# Patient Record
Sex: Male | Born: 1980 | Race: White | Hispanic: No | Marital: Married | Smoking: Never smoker
Health system: Southern US, Community
[De-identification: ages and names within clinical notes are randomized; demographics above are authoritative.]

## PROBLEM LIST (undated history)

## (undated) DIAGNOSIS — S060XAA Concussion with loss of consciousness status unknown, initial encounter: Secondary | ICD-10-CM

## (undated) DIAGNOSIS — S060X9A Concussion with loss of consciousness of unspecified duration, initial encounter: Secondary | ICD-10-CM

## (undated) DIAGNOSIS — E669 Obesity, unspecified: Secondary | ICD-10-CM

## (undated) HISTORY — PX: HERNIA REPAIR: SHX51

---
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
## 2004-03-16 ENCOUNTER — Emergency Department (HOSPITAL_COMMUNITY): Admission: EM | Admit: 2004-03-16 | Discharge: 2004-03-16 | Payer: Self-pay | Admitting: Emergency Medicine

## 2005-05-31 ENCOUNTER — Emergency Department (HOSPITAL_COMMUNITY): Admission: EM | Admit: 2005-05-31 | Discharge: 2005-05-31 | Payer: Self-pay | Admitting: Emergency Medicine

## 2005-08-30 ENCOUNTER — Emergency Department (HOSPITAL_COMMUNITY): Admission: EM | Admit: 2005-08-30 | Discharge: 2005-08-30 | Payer: Self-pay | Admitting: Emergency Medicine

## 2006-10-31 ENCOUNTER — Emergency Department (HOSPITAL_COMMUNITY): Admission: EM | Admit: 2006-10-31 | Discharge: 2006-10-31 | Payer: Self-pay | Admitting: Emergency Medicine

## 2007-04-03 ENCOUNTER — Emergency Department (HOSPITAL_COMMUNITY): Admission: EM | Admit: 2007-04-03 | Discharge: 2007-04-03 | Payer: Self-pay | Admitting: Emergency Medicine

## 2008-04-07 ENCOUNTER — Emergency Department (HOSPITAL_COMMUNITY): Admission: EM | Admit: 2008-04-07 | Discharge: 2008-04-07 | Payer: Self-pay | Admitting: Emergency Medicine

## 2008-04-20 ENCOUNTER — Emergency Department: Payer: Self-pay | Admitting: Unknown Physician Specialty

## 2008-04-20 ENCOUNTER — Other Ambulatory Visit: Payer: Self-pay

## 2008-06-10 ENCOUNTER — Emergency Department (HOSPITAL_COMMUNITY): Admission: EM | Admit: 2008-06-10 | Discharge: 2008-06-10 | Payer: Self-pay | Admitting: Emergency Medicine

## 2009-02-13 ENCOUNTER — Emergency Department (HOSPITAL_COMMUNITY): Admission: EM | Admit: 2009-02-13 | Discharge: 2009-02-14 | Payer: Self-pay | Admitting: Emergency Medicine

## 2010-01-26 ENCOUNTER — Emergency Department (HOSPITAL_COMMUNITY): Admission: EM | Admit: 2010-01-26 | Discharge: 2010-01-26 | Payer: Self-pay | Admitting: Emergency Medicine

## 2010-02-11 ENCOUNTER — Emergency Department (HOSPITAL_COMMUNITY): Admission: EM | Admit: 2010-02-11 | Discharge: 2010-02-11 | Payer: Self-pay | Admitting: Emergency Medicine

## 2010-02-13 ENCOUNTER — Emergency Department (HOSPITAL_COMMUNITY): Admission: EM | Admit: 2010-02-13 | Discharge: 2010-02-13 | Payer: Self-pay | Admitting: Emergency Medicine

## 2015-08-30 ENCOUNTER — Encounter (HOSPITAL_COMMUNITY): Payer: Self-pay | Admitting: *Deleted

## 2015-08-30 ENCOUNTER — Emergency Department (HOSPITAL_COMMUNITY)
Admission: EM | Admit: 2015-08-30 | Discharge: 2015-08-31 | Disposition: A | Discharge status: Home / Self Care | Payer: BLUE CROSS/BLUE SHIELD | Attending: Emergency Medicine | Admitting: Emergency Medicine

## 2015-08-30 DIAGNOSIS — J069 Acute upper respiratory infection, unspecified: Secondary | ICD-10-CM | POA: Diagnosis not present

## 2015-08-30 DIAGNOSIS — Z79899 Other long term (current) drug therapy: Secondary | ICD-10-CM | POA: Diagnosis not present

## 2015-08-30 DIAGNOSIS — E669 Obesity, unspecified: Secondary | ICD-10-CM | POA: Insufficient documentation

## 2015-08-30 DIAGNOSIS — R0602 Shortness of breath: Secondary | ICD-10-CM

## 2015-08-30 NOTE — ED Notes (Signed)
Pt treated for pneumonia two months ago, and the past two weeks getting more sob, and doesn't feel well

## 2015-08-31 ENCOUNTER — Emergency Department (HOSPITAL_COMMUNITY): Payer: BLUE CROSS/BLUE SHIELD

## 2015-08-31 LAB — CBC WITH DIFFERENTIAL/PLATELET
BASOS ABS: 0.1 10*3/uL (ref 0.0–0.1)
Basophils Relative: 1 % (ref 0–1)
EOS PCT: 4 % (ref 0–5)
Eosinophils Absolute: 0.4 10*3/uL (ref 0.0–0.7)
HEMATOCRIT: 38.9 % — AB (ref 39.0–52.0)
HEMOGLOBIN: 13.3 g/dL (ref 13.0–17.0)
LYMPHS ABS: 2.9 10*3/uL (ref 0.7–4.0)
Lymphocytes Relative: 33 % (ref 12–46)
MCH: 30.5 pg (ref 26.0–34.0)
MCHC: 34.2 g/dL (ref 30.0–36.0)
MCV: 89.2 fL (ref 78.0–100.0)
Monocytes Absolute: 0.8 10*3/uL (ref 0.1–1.0)
Monocytes Relative: 9 % (ref 3–12)
NEUTROS ABS: 4.5 10*3/uL (ref 1.7–7.7)
NEUTROS PCT: 53 % (ref 43–77)
PLATELETS: 218 10*3/uL (ref 150–400)
RBC: 4.36 MIL/uL (ref 4.22–5.81)
RDW: 12.8 % (ref 11.5–15.5)
WBC: 8.6 10*3/uL (ref 4.0–10.5)

## 2015-08-31 LAB — BASIC METABOLIC PANEL
ANION GAP: 5 (ref 5–15)
BUN: 17 mg/dL (ref 6–20)
CHLORIDE: 108 mmol/L (ref 101–111)
CO2: 25 mmol/L (ref 22–32)
Calcium: 8.8 mg/dL — ABNORMAL LOW (ref 8.9–10.3)
Creatinine, Ser: 1.04 mg/dL (ref 0.61–1.24)
GFR calc Af Amer: >60 mL/min (ref >60)
GLUCOSE: 102 mg/dL — AB (ref 65–99)
Potassium: 3.2 mmol/L — ABNORMAL LOW (ref 3.5–5.1)
Sodium: 138 mmol/L (ref 135–145)

## 2015-08-31 LAB — D-DIMER, QUANTITATIVE (NOT AT ARMC)

## 2015-08-31 LAB — TROPONIN I: Troponin I: <0.03 ng/mL (ref <0.031)

## 2015-08-31 MED ORDER — SALINE SPRAY 0.65 % NA SOLN: 1.0000 | NASAL | Status: DC | PRN

## 2015-08-31 NOTE — ED Notes (Signed)
Pt. Ambulated around the hall and back to his room on 98% room air and 89 heart rate.pt.gait steady on his feet. Pt. Complaints being "Wheezy."

## 2015-08-31 NOTE — ED Notes (Signed)
EKG given to EDP, Horton,MD., for review. 

## 2015-08-31 NOTE — ED Provider Notes (Signed)
CSN: [id number]   Arrival date & time 08/30/15 2155  History  This chart was scribed for Shon Baton, MD by Bethel Born, ED Scribe. This patient was seen in room WA22/WA22 and the patient's care was started at 12:12 AM.  Chief Complaint  Patient presents with  . Shortness of Breath    HPI The history is provided by the patient. No language interpreter was used.   Jonathan Guerra is a 34 y.o. male with PMHx of pneumonia and childhood asthma who presents to the Emergency Department complaining of increasing SOB with gradual onset 1 week ago. The SOB is worse with walking. Associated symptoms include cough, chest tightness, and mild sore throat. He had similar symptoms 2 months ago with pneumonia. Pt denies fever, abdominal pain, nausea, and vomiting. Denies tobacco use. No known sick contact. No cardiac history.   History reviewed. No pertinent past medical history.  No past surgical history on file.  History reviewed. No pertinent family history.  Social History  Substance Use Topics  . Smoking status: Never Smoker   . Smokeless tobacco: None  . Alcohol Use: Yes     Comment: very rare     Review of Systems  Constitutional: Negative.  Negative for fever.  HENT: Positive for congestion.   Respiratory: Positive for cough and shortness of breath. Negative for chest tightness.   Cardiovascular: Negative.  Negative for chest pain and leg swelling.  Gastrointestinal: Negative.  Negative for abdominal pain.  Genitourinary: Negative.  Negative for dysuria.  Neurological: Negative for headaches.  All other systems reviewed and are negative.  Home Medications   Prior to Admission medications   Medication Sig Start Date End Date Taking? Authorizing Provider  albuterol (PROVENTIL HFA;VENTOLIN HFA) 108 (90 BASE) MCG/ACT inhaler Inhale 4 puffs into the lungs once.   Yes Historical Provider, MD  sodium chloride (OCEAN) 0.65 % SOLN nasal spray Place 1 spray into both nostrils as needed  for congestion. 08/31/15   Shon Baton, MD    Allergies  Review of patient's allergies indicates no known allergies.  Triage Vitals: BP 129/82 mmHg  Pulse 90  Temp(Src) 97.6 F (36.4 C) (Oral)  Resp 22  Ht 6\' 3"  (1.905 m)  Wt 330 lb (149.687 kg)  BMI 41.25 kg/m2  SpO2 98%  Physical Exam  Constitutional: He is oriented to person, place, and time. He appears well-developed and well-nourished. No distress.  Obese  HENT:  Head: Normocephalic and atraumatic.  Eyes: Pupils are equal, round, and reactive to light.  Cardiovascular: Normal rate, regular rhythm and normal heart sounds.   No murmur heard. Pulmonary/Chest: Effort normal and breath sounds normal. No respiratory distress. He has no wheezes.  Distant breath sounds but clear  Abdominal: Soft. Bowel sounds are normal. There is no tenderness. There is no rebound.  Musculoskeletal: He exhibits no edema.  Neurological: He is alert and oriented to person, place, and time.  Skin: Skin is warm and dry.  Psychiatric: He has a normal mood and affect.  Nursing note and vitals reviewed.   ED Course  Procedures   DIAGNOSTIC STUDIES: Oxygen Saturation is 98% on RA, normal by my interpretation.    COORDINATION OF CARE: 12:14 AM Discussed treatment plan which includes lab work and CXR with pt at bedside and pt agreed to plan.  Labs Reviewed  CBC WITH DIFFERENTIAL/PLATELET - Abnormal; Notable for the following:    HCT 38.9 (*)    All other components within normal limits  BASIC  METABOLIC PANEL - Abnormal; Notable for the following:    Potassium 3.2 (*)    Glucose, Bld 102 (*)    Calcium 8.8 (*)    All other components within normal limits  TROPONIN I  D-DIMER, QUANTITATIVE (NOT AT [facility])    Imaging Review Dg Chest 2 View  08/31/2015   CLINICAL DATA:  34 year old male with shortness of breath  EXAM: CHEST  2 VIEW  COMPARISON:  Radiograph dated 02/11/2010  FINDINGS: The heart size and mediastinal contours are within  normal limits. Both lungs are clear. The visualized skeletal structures are unremarkable.  IMPRESSION: No active cardiopulmonary disease.   Electronically Signed   By: Elgie Collard M.D.   On: 08/31/2015 00:16    EKG Interpretation  Date/Time:  Saturday August 31 2015 01:40:17 EDT Ventricular Rate:  66 PR Interval:  138 QRS Duration: 117 QT Interval:  433 QTC Calculation: 454 R Axis:   37 Text Interpretation:  Sinus rhythm Nonspecific intraventricular conduction delay Low voltage, precordial leads Borderline T abnormalities, inferior leads No longer bradycardic Confirmed by Erilyn Pearman  MD, Makinna Andy ([id number]) on 08/31/2015 1:43:37 AM    MDM   Final diagnoses:  Shortness of breath  Upper respiratory infection   Patient presents with shortness of breath. Reports associated cough and congestion. No fevers.  He is nontoxic and in no acute respiratory distress. Satting 98% on room air. Basic labwork obtained and reassuring. Chest x-ray negative for pneumonia. EKG shows no evidence of acute ischemia. Discussed this with the patient. He's currently clear without wheezing. Doubt HFA would help. Patient reports that shortness of breath has been progressive and ongoing. Worse with exertion. No risk factors for PE and suspect symptoms are related to URI given cough and congestion; however, we'll send screening d-dimer. D-dimer is negative. Discussed the results with the patient has wife. Nasal saline and nasal sterile for congestion. Patient was given cardiology follow-up if symptoms persist as he may need formal stress testing. He is otherwise low risk with the exception of obesity. Patient was able to ambulate and maintain pulse ox.  After history, exam, and medical workup I feel the patient has been appropriately medically screened and is safe for discharge home. Pertinent diagnoses were discussed with the patient. Patient was given return precautions.  I personally performed the services described  in this documentation, which was scribed in my presence. The recorded information has been reviewed and is accurate.   Shon Baton, MD 08/31/15 [phone]

## 2015-08-31 NOTE — Discharge Instructions (Signed)
You were seen today for shortness of breath. The exact cause is unknown. There is no evidence of pneumonia. Your screening for blood clots was negative and your workup was otherwise reassuring. Given your congestion, this may be related to a viral upper respiratory infection. However, given your progressive nature of your symptoms, you will be given cardiology follow-up information if your symptoms persist. Sometimes shortness of breath can be related to heart issues.  Upper Respiratory Infection, Adult An upper respiratory infection (URI) is also sometimes known as the common cold. The upper respiratory tract includes the nose, sinuses, throat, trachea, and bronchi. Bronchi are the airways leading to the lungs. Most people improve within 1 week, but symptoms can last up to 2 weeks. A residual cough may last even longer.  CAUSES Many different viruses can infect the tissues lining the upper respiratory tract. The tissues become irritated and inflamed and often become very moist. Mucus production is also common. A cold is contagious. You can easily spread the virus to others by oral contact. This includes kissing, sharing a glass, coughing, or sneezing. Touching your mouth or nose and then touching a surface, which is then touched by another person, can also spread the virus. SYMPTOMS  Symptoms typically develop 1 to 3 days after you come in contact with a cold virus. Symptoms vary from person to person. They may include:  Runny nose.  Sneezing.  Nasal congestion.  Sinus irritation.  Sore throat.  Loss of voice (laryngitis).  Cough.  Fatigue.  Muscle aches.  Loss of appetite.  Headache.  Low-grade fever. DIAGNOSIS  You might diagnose your own cold based on familiar symptoms, since most people get a cold 2 to 3 times a year. Your caregiver can confirm this based on your exam. Most importantly, your caregiver can check that your symptoms are not due to another disease such as strep  throat, sinusitis, pneumonia, asthma, or epiglottitis. Blood tests, throat tests, and X-rays are not necessary to diagnose a common cold, but they may sometimes be helpful in excluding other more serious diseases. Your caregiver will decide if any further tests are required. RISKS AND COMPLICATIONS  You may be at risk for a more severe case of the common cold if you smoke cigarettes, have chronic heart disease (such as heart failure) or lung disease (such as asthma), or if you have a weakened immune system. The very young and very old are also at risk for more serious infections. Bacterial sinusitis, middle ear infections, and bacterial pneumonia can complicate the common cold. The common cold can worsen asthma and chronic obstructive pulmonary disease (COPD). Sometimes, these complications can require emergency medical care and may be life-threatening. PREVENTION  The best way to protect against getting a cold is to practice good hygiene. Avoid oral or hand contact with people with cold symptoms. Wash your hands often if contact occurs. There is no clear evidence that vitamin C, vitamin E, echinacea, or exercise reduces the chance of developing a cold. However, it is always recommended to get plenty of rest and practice good nutrition. TREATMENT  Treatment is directed at relieving symptoms. There is no cure. Antibiotics are not effective, because the infection is caused by a virus, not by bacteria. Treatment may include:  Increased fluid intake. Sports drinks offer valuable electrolytes, sugars, and fluids.  Breathing heated mist or steam (vaporizer or shower).  Eating chicken soup or other clear broths, and maintaining good nutrition.  Getting plenty of rest.  Using gargles or lozenges  for comfort.  Controlling fevers with ibuprofen or acetaminophen as directed by your caregiver.  Increasing usage of your inhaler if you have asthma. Zinc gel and zinc lozenges, taken in the first 24 hours of  the common cold, can shorten the duration and lessen the severity of symptoms. Pain medicines may help with fever, muscle aches, and throat pain. A variety of non-prescription medicines are available to treat congestion and runny nose. Your caregiver can make recommendations and may suggest nasal or lung inhalers for other symptoms.  HOME CARE INSTRUCTIONS   Only take over-the-counter or prescription medicines for pain, discomfort, or fever as directed by your caregiver.  Use a warm mist humidifier or inhale steam from a shower to increase air moisture. This may keep secretions moist and make it easier to breathe.  Drink enough water and fluids to keep your urine clear or pale yellow.  Rest as needed.  Return to work when your temperature has returned to normal or as your caregiver advises. You may need to stay home longer to avoid infecting others. You can also use a face mask and careful hand washing to prevent spread of the virus. SEEK MEDICAL CARE IF:   After the first few days, you feel you are getting worse rather than better.  You need your caregiver's advice about medicines to control symptoms.  You develop chills, worsening shortness of breath, or brown or red sputum. These may be signs of pneumonia.  You develop yellow or brown nasal discharge or pain in the face, especially when you bend forward. These may be signs of sinusitis.  You develop a fever, swollen neck glands, pain with swallowing, or white areas in the back of your throat. These may be signs of strep throat. SEEK IMMEDIATE MEDICAL CARE IF:   You have a fever.  You develop severe or persistent headache, ear pain, sinus pain, or chest pain.  You develop wheezing, a prolonged cough, cough up blood, or have a change in your usual mucus (if you have chronic lung disease).  You develop sore muscles or a stiff neck. Document Released: 06/02/2001 Document Revised: 02/29/2012 Document Reviewed: 03/14/2014 [facility]  Patient Information 2015 [place], [place]. This information is not intended to replace advice given to you by your health care provider. Make sure you discuss any questions you have with your health care provider. Shortness of Breath Shortness of breath means you have trouble breathing. It could also mean that you have a medical problem. You should get immediate medical care for shortness of breath. CAUSES   Not enough oxygen in the air such as with high altitudes or a smoke-filled room.  Certain lung diseases, infections, or problems.  Heart disease or conditions, such as angina or heart failure.  Low red blood cells (anemia).  Poor physical fitness, which can cause shortness of breath when you exercise.  Chest or back injuries or stiffness.  Being overweight.  Smoking.  Anxiety, which can make you feel like you are not getting enough air. DIAGNOSIS  Serious medical problems can often be found during your physical exam. Tests may also be done to determine why you are having shortness of breath. Tests may include:  Chest X-rays.  Lung function tests.  Blood tests.  An electrocardiogram (ECG).  An ambulatory electrocardiogram. An ambulatory ECG records your heartbeat patterns over a 24-hour period.  Exercise testing.  A transthoracic echocardiogram (TTE). During echocardiography, sound waves are used to evaluate how blood flows through your heart.  A transesophageal  echocardiogram (TEE).  Imaging scans. Your health care provider may not be able to find a cause for your shortness of breath after your exam. In this case, it is important to have a follow-up exam with your health care provider as directed.  TREATMENT  Treatment for shortness of breath depends on the cause of your symptoms and can vary greatly. HOME CARE INSTRUCTIONS   Do not smoke. Smoking is a common cause of shortness of breath. If you smoke, ask for help to quit.  Avoid being around chemicals or things  that may bother your breathing, such as paint fumes and dust.  Rest as needed. Slowly resume your usual activities.  If medicines were prescribed, take them as directed for the full length of time directed. This includes oxygen and any inhaled medicines.  Keep all follow-up appointments as directed by your health care provider. SEEK MEDICAL CARE IF:   Your condition does not improve in the time expected.  You have a hard time doing your normal activities even with rest.  You have any new symptoms. SEEK IMMEDIATE MEDICAL CARE IF:   Your shortness of breath gets worse.  You feel light-headed, faint, or develop a cough not controlled with medicines.  You start coughing up blood.  You have pain with breathing.  You have chest pain or pain in your arms, shoulders, or abdomen.  You have a fever.  You are unable to walk up stairs or exercise the way you normally do. MAKE SURE YOU:  Understand these instructions.  Will watch your condition.  Will get help right away if you are not doing well or get worse. Document Released: 09/01/2001 Document Revised: 12/12/2013 Document Reviewed: 02/22/2012 [facility] Patient Information 2015 [place], [place]. This information is not intended to replace advice given to you by your health care provider. Make sure you discuss any questions you have with your health care provider.

## 2018-02-12 ENCOUNTER — Encounter (HOSPITAL_COMMUNITY): Payer: Self-pay | Admitting: Emergency Medicine

## 2018-02-12 ENCOUNTER — Emergency Department (HOSPITAL_COMMUNITY)
Admission: EM | Admit: 2018-02-12 | Discharge: 2018-02-12 | Disposition: A | Discharge status: Home / Self Care | Payer: Self-pay | Attending: Emergency Medicine | Admitting: Emergency Medicine

## 2018-02-12 ENCOUNTER — Emergency Department (HOSPITAL_COMMUNITY): Payer: Self-pay

## 2018-02-12 DIAGNOSIS — R0989 Other specified symptoms and signs involving the circulatory and respiratory systems: Secondary | ICD-10-CM | POA: Insufficient documentation

## 2018-02-12 DIAGNOSIS — H9202 Otalgia, left ear: Secondary | ICD-10-CM | POA: Insufficient documentation

## 2018-02-12 DIAGNOSIS — R0602 Shortness of breath: Secondary | ICD-10-CM | POA: Insufficient documentation

## 2018-02-12 MED ORDER — DIPHENHYDRAMINE HCL 25 MG PO CAPS
25.0000 mg | ORAL_CAPSULE | Freq: Once | ORAL | Status: AC
  Administered 2018-02-12: 25 mg via ORAL
  Filled 2018-02-12: qty 1

## 2018-02-12 MED ORDER — GI COCKTAIL ~~LOC~~
30.0000 mL | Freq: Once | ORAL | Status: AC
  Administered 2018-02-12: 30 mL via ORAL
  Filled 2018-02-12: qty 30

## 2018-02-12 MED ORDER — PREDNISONE 20 MG PO TABS: 40.0000 mg | ORAL_TABLET | Freq: Every day | ORAL | 0 refills | Status: DC

## 2018-02-12 MED ORDER — DIPHENHYDRAMINE HCL 25 MG PO TABS: 25.0000 mg | ORAL_TABLET | Freq: Four times a day (QID) | ORAL | 0 refills | Status: DC

## 2018-02-12 MED ORDER — PREDNISONE 20 MG PO TABS
60.0000 mg | ORAL_TABLET | Freq: Once | ORAL | Status: AC
  Administered 2018-02-12: 60 mg via ORAL
  Filled 2018-02-12: qty 3

## 2018-02-12 MED ORDER — FAMOTIDINE 20 MG PO TABS
20.0000 mg | ORAL_TABLET | Freq: Once | ORAL | Status: AC
  Administered 2018-02-12: 20 mg via ORAL
  Filled 2018-02-12: qty 1

## 2018-02-12 NOTE — Discharge Instructions (Signed)
He was seen today for a foreign body sensation in her throat and shortness of breath.  It is unclear whether you are actually having an allergic reaction.  You were treated as such.  You do have some swelling of her uvula which can be allergic in nature.  This seems isolated.  You were treated with steroids, Benadryl, and Pepcid.  Can continue at home.  [facility].  Avoid fish in the future.

## 2018-02-12 NOTE — ED Provider Notes (Signed)
[facility] EMERGENCY DEPARTMENT Provider Note   CSN: [id number] Arrival date & time: 02/12/18  0108     History   Chief Complaint Chief Complaint  Patient presents with  . Otalgia  . Allergic Reaction    HPI Jonathan Guerra is a 37 y.o. male.  HPI  This is a 37 year old male who presents with several complaints.  Denies any significant past medical history.  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  Yesterday he ate some canned tuna and since that time has had sore throat and a foreign body sensation.  He has been able to drink and eat but states that it hurts.  Rates his pain at 10 out of 10.  He has not taken anything for his symptoms.  Denies any fevers.  Has not taken anything for his symptoms.  At baseline reports shortness of breath.  This is not new.  He also states that he has pain in his left ear.  He feels that he was bitten by spider.  History reviewed. No pertinent past medical history.  There are no active problems to display for this patient.   History reviewed. No pertinent surgical history.     Home Medications    Prior to Admission medications   Medication Sig Start Date End Date Taking? Authorizing Provider  albuterol (PROVENTIL HFA;VENTOLIN HFA) 108 (90 BASE) MCG/ACT inhaler Inhale 4 puffs into the lungs once.    [provider]  diphenhydrAMINE (BENADRYL) 25 MG tablet Take 1 tablet (25 mg total) by mouth every 6 (six) hours. 02/12/18   Jonisha Kindig, Mayer Masker, MD  predniSONE (DELTASONE) 20 MG tablet Take 2 tablets (40 mg total) by mouth daily. 02/12/18   Jeanni Allshouse, Mayer Masker, MD  sodium chloride (OCEAN) 0.65 % SOLN nasal spray Place 1 spray into both nostrils as needed for congestion. 08/31/15   Sheritta Deeg, Mayer Masker, MD    Family History No family history on file.  Social History Social History   Tobacco Use  . Smoking status: Never Smoker  . Smokeless tobacco: Never Used  Substance Use Topics  . Alcohol use: Yes   Comment: very rare  . Drug use: No     Allergies   Patient has no known allergies.   Review of Systems Review of Systems  Constitutional: Negative for fever.  HENT: Positive for ear pain and sore throat. Negative for trouble swallowing.   Respiratory: Positive for shortness of breath. Negative for cough.   Cardiovascular: Negative for chest pain.  Gastrointestinal: Negative for abdominal pain, nausea and vomiting.  Genitourinary: Negative for dysuria.  Skin: Negative for rash.  All other systems reviewed and are negative.    Physical Exam Updated Vital Signs BP (!) 128/98   Pulse 89   Temp 98 F (36.7 C) (Oral)   Resp 16   SpO2 97%   Physical Exam  Constitutional: He is oriented to person, place, and time. He appears well-developed and well-nourished.  Obese, no acute distress  HENT:  Head: Normocephalic and atraumatic.  Right Ear: External ear normal.  Left Ear: External ear normal.  Mouth/Throat: Oropharynx is clear and moist.  Eyes: Pupils are equal, round, and reactive to light.  Neck: Normal range of motion. Neck supple.  Cardiovascular: Normal rate, regular rhythm and normal heart sounds.  No murmur heard. Pulmonary/Chest: Effort normal and breath sounds normal. No respiratory distress. He has no wheezes.  Abdominal: Soft. Bowel sounds are normal. There is no tenderness. There is no  rebound.  Musculoskeletal: He exhibits edema.  Neurological: He is alert and oriented to person, place, and time.  Skin: Skin is warm and dry.  Psychiatric: He has a normal mood and affect.  Nursing note and vitals reviewed.    ED Treatments / Results  Labs (all labs ordered are listed, but only abnormal results are displayed) Labs Reviewed - No data to display  EKG  EKG Interpretation None       Radiology Dg Chest 2 View  Result Date: 02/12/2018 CLINICAL DATA:  Shortness of breath EXAM: CHEST  2 VIEW COMPARISON:  08/31/2015 FINDINGS: The heart size and mediastinal  contours are within normal limits. Both lungs are clear. The visualized skeletal structures are unremarkable. IMPRESSION: No active cardiopulmonary disease. Electronically Signed   By: Jasmine PangKim  Fujinaga M.D.   On: 02/12/2018 03:03    Procedures Procedures (including critical care time)  Medications Ordered in ED Medications  diphenhydrAMINE (BENADRYL) capsule 25 mg (25 mg Oral Given 02/12/18 0259)  famotidine (PEPCID) tablet 20 mg (20 mg Oral Given 02/12/18 0259)  predniSONE (DELTASONE) tablet 60 mg (60 mg Oral Given 02/12/18 0259)  gi cocktail (Maalox,Lidocaine,Donnatal) (30 mLs Oral Given 02/12/18 0424)     Initial Impression / Assessment and Plan / ED Course  I have reviewed the triage vital signs and the nursing notes.  Pertinent labs & imaging results that were available during my care of the patient were reviewed by me and considered in my medical decision making (see chart for details).     Patient presents with foreign body sensation of the throat and sore throat after eating tuna.  Also reports persistent shortness of breath.  He is nontoxic no signs or symptoms of anaphylaxis.  Suspect given his symptoms that this may be more related to a mild impaction or esophageal irritation versus true allergic reaction.  Given shortness of breath, chest x-ray was obtained and does not show any evidence of pneumonia or foreign body.  Patient is tolerating fluids without difficulty.  Given patient's concern and history of reported fish allergy, he was given prednisone, Benadryl, Pepcid.  No indication at this time for epinephrine.  I discussed this with the patient.  He does not have a primary care physician.  [facility].  After history, exam, and medical workup I feel the patient has been appropriately medically screened and is safe for discharge home. Pertinent diagnoses were discussed with the patient. Patient was given return precautions.   Final Clinical Impressions(s) /  ED Diagnoses   Final diagnoses:  Foreign body sensation in throat  SOB (shortness of breath)    ED Discharge Orders        Ordered    diphenhydrAMINE (BENADRYL) 25 MG tablet  Every 6 hours     02/12/18 0430    predniSONE (DELTASONE) 20 MG tablet  Daily     02/12/18 0430       Shon BatonHorton, Spirit Wernli F, MD 02/12/18 [phone]

## 2018-02-12 NOTE — ED Triage Notes (Signed)
Pt c/o sore throat after eating tuna at noon today. Pt reports an allergy to fish. LS clear, NAD noted. Pt also c/o ear pain after being bitten by a spider 3 days ago.

## 2018-07-05 ENCOUNTER — Encounter (HOSPITAL_COMMUNITY): Payer: Self-pay

## 2018-07-05 ENCOUNTER — Other Ambulatory Visit: Payer: Self-pay

## 2018-07-05 ENCOUNTER — Emergency Department (HOSPITAL_COMMUNITY): Payer: Self-pay

## 2018-07-05 ENCOUNTER — Emergency Department (HOSPITAL_COMMUNITY)
Admission: EM | Admit: 2018-07-05 | Discharge: 2018-07-05 | Disposition: A | Discharge status: Home / Self Care | Payer: Self-pay | Attending: Emergency Medicine | Admitting: Emergency Medicine

## 2018-07-05 DIAGNOSIS — Z79899 Other long term (current) drug therapy: Secondary | ICD-10-CM | POA: Insufficient documentation

## 2018-07-05 DIAGNOSIS — K429 Umbilical hernia without obstruction or gangrene: Secondary | ICD-10-CM | POA: Insufficient documentation

## 2018-07-05 HISTORY — DX: Obesity, unspecified: E66.9

## 2018-07-05 HISTORY — DX: Concussion with loss of consciousness status unknown, initial encounter: S06.0XAA

## 2018-07-05 HISTORY — DX: Concussion with loss of consciousness of unspecified duration, initial encounter: S06.0X9A

## 2018-07-05 LAB — BASIC METABOLIC PANEL
Anion gap: 8 (ref 5–15)
BUN: 12 mg/dL (ref 6–20)
CALCIUM: 9.4 mg/dL (ref 8.9–10.3)
CO2: 26 mmol/L (ref 22–32)
Chloride: 106 mmol/L (ref 98–111)
Creatinine, Ser: 0.98 mg/dL (ref 0.61–1.24)
GFR calc Af Amer: >60 mL/min (ref >60)
GFR calc non Af Amer: >60 mL/min (ref >60)
GLUCOSE: 101 mg/dL — AB (ref 70–99)
Potassium: 4.2 mmol/L (ref 3.5–5.1)
Sodium: 140 mmol/L (ref 135–145)

## 2018-07-05 LAB — URINALYSIS, ROUTINE W REFLEX MICROSCOPIC
BILIRUBIN URINE: NEGATIVE
Glucose, UA: NEGATIVE mg/dL
HGB URINE DIPSTICK: NEGATIVE
Ketones, ur: 20 mg/dL — AB
Leukocytes, UA: NEGATIVE
Nitrite: NEGATIVE
Protein, ur: NEGATIVE mg/dL
SPECIFIC GRAVITY, URINE: 1.029 (ref 1.005–1.030)
pH: 5 (ref 5.0–8.0)

## 2018-07-05 LAB — CBC
HCT: 42.8 % (ref 39.0–52.0)
Hemoglobin: 14.7 g/dL (ref 13.0–17.0)
MCH: 30.8 pg (ref 26.0–34.0)
MCHC: 34.3 g/dL (ref 30.0–36.0)
MCV: 89.7 fL (ref 78.0–100.0)
PLATELETS: 279 10*3/uL (ref 150–400)
RBC: 4.77 MIL/uL (ref 4.22–5.81)
RDW: 12.7 % (ref 11.5–15.5)
WBC: 9.3 10*3/uL (ref 4.0–10.5)

## 2018-07-05 LAB — HEPATIC FUNCTION PANEL
ALT: 42 U/L (ref 0–44)
AST: 35 U/L (ref 15–41)
Albumin: 3.9 g/dL (ref 3.5–5.0)
Alkaline Phosphatase: 57 U/L (ref 38–126)
BILIRUBIN TOTAL: 0.7 mg/dL (ref 0.3–1.2)
Total Protein: 7.2 g/dL (ref 6.5–8.1)

## 2018-07-05 LAB — I-STAT TROPONIN, ED
TROPONIN I, POC: 0 ng/mL (ref 0.00–0.08)
TROPONIN I, POC: 0 ng/mL (ref 0.00–0.08)

## 2018-07-05 LAB — LIPASE, BLOOD: LIPASE: 24 U/L (ref 11–51)

## 2018-07-05 MED ORDER — IOPAMIDOL (ISOVUE-300) INJECTION 61%
INTRAVENOUS | Status: AC
  Filled 2018-07-05: qty 100

## 2018-07-05 MED ORDER — DICYCLOMINE HCL 10 MG/ML IM SOLN
20.0000 mg | Freq: Once | INTRAMUSCULAR | Status: AC
  Administered 2018-07-05: 20 mg via INTRAMUSCULAR
  Filled 2018-07-05: qty 2

## 2018-07-05 MED ORDER — DICYCLOMINE HCL 20 MG PO TABS: 20.0000 mg | ORAL_TABLET | Freq: Two times a day (BID) | ORAL | 0 refills | Status: DC

## 2018-07-05 MED ORDER — IOPAMIDOL (ISOVUE-300) INJECTION 61%
100.0000 mL | Freq: Once | INTRAVENOUS | Status: AC | PRN
  Administered 2018-07-05: 100 mL via INTRAVENOUS

## 2018-07-05 MED ORDER — FAMOTIDINE 20 MG PO TABS: 20.0000 mg | ORAL_TABLET | Freq: Two times a day (BID) | ORAL | 0 refills | Status: DC

## 2018-07-05 MED ORDER — FAMOTIDINE IN NACL 20-0.9 MG/50ML-% IV SOLN
20.0000 mg | Freq: Once | INTRAVENOUS | Status: AC
Start: 2018-07-05 — End: 2018-07-05
  Administered 2018-07-05: 20 mg via INTRAVENOUS
  Filled 2018-07-05: qty 50

## 2018-07-05 MED ORDER — SODIUM CHLORIDE 0.9 % IV BOLUS
1000.0000 mL | Freq: Once | INTRAVENOUS | Status: AC
  Administered 2018-07-05: 1000 mL via INTRAVENOUS

## 2018-07-05 NOTE — ED Notes (Signed)
Patient reports he is having 10/10 abdominal pain, but refuses pain medication at this time. He says he wants to wait until "they figure out what is going on."

## 2018-07-05 NOTE — ED Triage Notes (Signed)
Patient c/o left chest pain that radiates into the back,  mid abdominal pain, gastrointestinal reflux, and nausea x 2 days. patient states the abdominal pain started first. Patient reports that he has SOB,but is not unusual due to weight.

## 2018-07-05 NOTE — ED Provider Notes (Addendum)
[facility]-EMERGENCY DEPT Provider Note   CSN: [id number] Arrival date & time: 07/05/18  1014     History   Chief Complaint Chief Complaint  Patient presents with  . Chest Pain  . Abdominal Pain  . Gastroesophageal Reflux    HPI Jonathan Guerra is a 37 y.o. male who presents to ED for evaluation of periumbilical abdominal pain now radiating to right lower quadrant and right groin for the past 5 days, nausea and heartburn sensation.  States that he has never experienced heartburn before.  Describes the heartburn as pain starting in his throat and radiating down to the epigastric area.  No improvement with changing diet.  He also has pinpoint left-sided chest pain worse with palpation.  This started yesterday.  No history of similar symptoms in the past.  He has not taken any medicine to help with his symptoms.  Denies any prior MI, DVT, PE, abdominal surgeries, chronic NSAID use.  Denies any changes in urination, diarrhea, constipation, recent surgeries, recent prolonged travel, hormone use.  Has a history of kidney stones but states that this feels different.  No sick contacts with similar symptoms.  HPI  Past Medical History:  Diagnosis Date  . Concussion   . Obesity     There are no active problems to display for this patient.   Past Surgical History:  Procedure Laterality Date  . HERNIA REPAIR          Home Medications    Prior to Admission medications   Medication Sig Start Date End Date Taking? Authorizing Provider  diphenhydrAMINE (BENADRYL) 25 MG tablet Take 1 tablet (25 mg total) by mouth every 6 (six) hours. 02/12/18  Yes Horton, Mayer Masker, MD  dicyclomine (BENTYL) 20 MG tablet Take 1 tablet (20 mg total) by mouth 2 (two) times daily. 07/05/18   Heer Justiss, PA-C  famotidine (PEPCID) 20 MG tablet Take 1 tablet (20 mg total) by mouth 2 (two) times daily. 07/05/18   Laryn Venning, PA-C  predniSONE (DELTASONE) 20 MG tablet Take 2 tablets (40 mg  total) by mouth daily. Patient not taking: Reported on 07/05/2018 02/12/18   Horton, Mayer Masker, MD  sodium chloride (OCEAN) 0.65 % SOLN nasal spray Place 1 spray into both nostrils as needed for congestion. Patient not taking: Reported on 07/05/2018 08/31/15   Horton, Mayer Masker, MD    Family History Family History  Problem Relation Age of Onset  . Diabetes Mother   . Stroke Father   . Heart failure Father     Social History Social History   Tobacco Use  . Smoking status: Never Smoker  . Smokeless tobacco: Never Used  Substance Use Topics  . Alcohol use: Yes    Comment: very rare  . Drug use: No     Allergies   Shrimp [shellfish allergy]   Review of Systems Review of Systems  Constitutional: Positive for appetite change. Negative for chills and fever.  HENT: Negative for ear pain, rhinorrhea, sneezing and sore throat.   Eyes: Negative for photophobia and visual disturbance.  Respiratory: Negative for cough, chest tightness, shortness of breath and wheezing.   Cardiovascular: Positive for chest pain. Negative for palpitations.  Gastrointestinal: Positive for abdominal pain and nausea. Negative for blood in stool, constipation, diarrhea and vomiting.  Genitourinary: Negative for dysuria, hematuria and urgency.  Musculoskeletal: Negative for myalgias.  Skin: Negative for rash.  Neurological: Negative for dizziness, weakness and light-headedness.     Physical Exam Updated Vital  Signs BP 117/67   Pulse (!) 56   Temp 98.3 F (36.8 C) (Oral)   Resp 18   Ht 6\' 3"  (1.905 m)   Wt (!) 170.1 kg (375 lb)   SpO2 99%   BMI 46.87 kg/m   Physical Exam  Constitutional: He appears well-developed and well-nourished. No distress.  HENT:  Head: Normocephalic and atraumatic.  Nose: Nose normal.  Eyes: Conjunctivae and EOM are normal. Left eye exhibits no discharge. No scleral icterus.  Neck: Normal range of motion. Neck supple.  Cardiovascular: Normal rate, regular rhythm,  normal heart sounds and intact distal pulses. Exam reveals no gallop and no friction rub.  No murmur heard. Pulmonary/Chest: Effort normal and breath sounds normal. No respiratory distress. He exhibits tenderness.    Abdominal: Soft. Bowel sounds are normal. He exhibits no distension. There is tenderness in the right lower quadrant, periumbilical area and suprapubic area. There is no guarding.  Bilateral CVA tenderness.  Musculoskeletal: Normal range of motion. He exhibits no edema.  Neurological: He is alert. He exhibits normal muscle tone. Coordination normal.  Skin: Skin is warm and dry. No rash noted.  Psychiatric: He has a normal mood and affect.  Nursing note and vitals reviewed.    ED Treatments / Results  Labs (all labs ordered are listed, but only abnormal results are displayed) Labs Reviewed  BASIC METABOLIC PANEL - Abnormal; Notable for the following components:      Result Value   Glucose, Bld 101 (*)    All other components within normal limits  URINALYSIS, ROUTINE W REFLEX MICROSCOPIC - Abnormal; Notable for the following components:   APPearance HAZY (*)    Ketones, ur 20 (*)    All other components within normal limits  CBC  HEPATIC FUNCTION PANEL  LIPASE, BLOOD  I-STAT TROPONIN, ED  I-STAT TROPONIN, ED    EKG EKG Interpretation  Date/Time:  Tuesday July 05 2018 10:31:03 EDT Ventricular Rate:  75 PR Interval:    QRS Duration: 105 QT Interval:  382 QTC Calculation: 427 R Axis:   30 Text Interpretation:  Sinus rhythm Borderline T abnormalities, inferior leads No significant change since last tracing Confirmed by Shaune PollackIsaacs, Cameron [phone]) on 07/05/2018 10:08:26 PM   Radiology Dg Chest 2 View  Result Date: 07/05/2018 CLINICAL DATA:  Left chest pain radiating into the left shoulder and scapula for 2 days EXAM: CHEST - 2 VIEW COMPARISON:  02/12/2018 FINDINGS: The heart size and mediastinal contours are within normal limits. Both lungs are clear. The visualized  skeletal structures are unremarkable. IMPRESSION: No active cardiopulmonary disease. Electronically Signed   By: Elige KoHetal  Patel   On: 07/05/2018 10:55   Ct Abdomen Pelvis W Contrast  Result Date: 07/05/2018 CLINICAL DATA:  Nausea for 2 days, LEFT chest pain radiating to back. Shortness of breath. History of hernia repair. EXAM: CT ABDOMEN AND PELVIS WITH CONTRAST TECHNIQUE: Multidetector CT imaging of the abdomen and pelvis was performed using the standard protocol following bolus administration of intravenous contrast. CONTRAST:  100mL ISOVUE-300 IOPAMIDOL (ISOVUE-300) INJECTION 61% COMPARISON:  None. FINDINGS: LOWER CHEST: Lung bases are clear. Included heart size is normal. No pericardial effusion. HEPATOBILIARY: The liver is diffusely hypodense compatible with steatosis, focal fatty sparing about the gallbladder fossa. Normal gallbladder. PANCREAS: Normal. SPLEEN: Normal. ADRENALS/URINARY TRACT: Kidneys are orthotopic, demonstrating symmetric enhancement. No nephrolithiasis, hydronephrosis or solid renal masses. The unopacified ureters are normal in course and caliber. Urinary bladder is decompressed and unremarkable. Normal adrenal glands. STOMACH/BOWEL: The stomach, small  and large bowel are normal in course and caliber without inflammatory changes, sensitivity decreased without enteric contrast. Mild colonic diverticulosis. Normal appendix. VASCULAR/LYMPHATIC: Aortoiliac vessels are normal in course and caliber. No lymphadenopathy by CT size criteria. REPRODUCTIVE: Normal. OTHER: No intraperitoneal free fluid or free air. MUSCULOSKELETAL: Focal anterior abdominal wall supraumbilical subcutaneous fat stranding. No subcutaneous gas or radiopaque foreign bodies. Small bilateral fat containing inguinal hernias. Small to moderate fat containing umbilical hernia. Moderate L5-S1 facet arthropathy and disc bulge. Moderate neural foraminal narrowing. IMPRESSION: 1. No acute intra-abdominal or pelvic process. 2.  Hepatic steatosis. 3. Small to moderate fat containing umbilical hernia with supraumbilical focal subcutaneous fat stranding seen with necrosis or contusion. Electronically Signed   By: Awilda Metro M.D.   On: 07/05/2018 18:26    Procedures Procedures (including critical care time)  Medications Ordered in ED Medications  sodium chloride 0.9 % bolus 1,000 mL (0 mLs Intravenous Stopped 07/05/18 1821)  iopamidol (ISOVUE-300) 61 % injection 100 mL (100 mLs Intravenous Contrast Given 07/05/18 1757)  famotidine (PEPCID) IVPB 20 mg premix (20 mg Intravenous New Bag/Given 07/05/18 2106)  dicyclomine (BENTYL) injection 20 mg (20 mg Intramuscular Given 07/05/18 2106)     Initial Impression / Assessment and Plan / ED Course  I have reviewed the triage vital signs and the nursing notes.  Pertinent labs & imaging results that were available during my care of the patient were reviewed by me and considered in my medical decision making (see chart for details).  Clinical Course as of Jul 06 2215  Tue Jul 05, 2018  1640 Patient declines pain medication.   [HK]    Clinical Course User Index [HK] Dietrich Pates, PA-C    37 year old male presents to ED for evaluation of periumbilical abdominal pain radiating to right lower quadrant and right groin for the past 5 days, nausea and heartburn sensation.  Also reports pinpoint left-sided chest pain worse with palpation.  No history of similar symptoms either in the past.  Has not taken any medicine help with the symptoms.  Denies any prior MI, PE, changes in bowel movements, chronic NSAID use, recent surgeries, recent prolonged trip for hormone use.  Feels different than his history of kidney stones.  On physical exam patient is overall well-appearing.  There is periumbilical, suprapubic, right lower quadrant and left lower quadrant abdominal tenderness to palpation without rebound or guarding noted.  He appears overall well.  He is afebrile.  He is not  tachycardic, tachypneic or hypotensive.  Does not appear dehydrated.  EKG shows no changes from prior tracings.  Troponin negative x2.  CBC, CMP, urinalysis, lipase unremarkable.  Chest x-ray unremarkable.  CT of abdomen pelvis with no acute findings but did show umbilical fat-containing hernia.  Patient refused pain medication.  Some improvement with Pepcid and Bentyl given here.  Will discharge home with these medications, surgery follow-up.  Suspect MSK  Cause of chest pain. Doubt cardiac or pulmonary etiology based on PERC negative, low risk by HEART score. Patient again declines any other pain medication.  Advised to return to ED for any severe worsening symptoms.  Portions of this note were generated with Scientist, clinical (histocompatibility and immunogenetics). Dictation errors may occur despite best attempts at proofreading.   Final Clinical Impressions(s) / ED Diagnoses   Final diagnoses:  Umbilical hernia without obstruction and without gangrene    ED Discharge Orders        Ordered    famotidine (PEPCID) 20 MG tablet  2 times daily  07/05/18 2214    dicyclomine (BENTYL) 20 MG tablet  2 times daily     07/05/18 2214          Dietrich Pates, PA-C 07/05/18 2220    Shaune Pollack, MD 07/06/18 1006

## 2018-07-05 NOTE — ED Notes (Signed)
Patient is aware urine sample needed 

## 2018-07-05 NOTE — Discharge Instructions (Signed)
Please read attached information regarding your condition. Take the following medications to help with your heartburn and abdominal discomfort. Return to ED for worsening symptoms, fever, vomiting up blood, blood in your stool, severe chest pain or trouble breathing.

## 2019-07-06 ENCOUNTER — Encounter (HOSPITAL_COMMUNITY): Payer: Self-pay | Admitting: Pharmacy Technician

## 2019-07-06 ENCOUNTER — Other Ambulatory Visit: Payer: Self-pay

## 2019-07-06 ENCOUNTER — Emergency Department (HOSPITAL_COMMUNITY)
Admission: EM | Admit: 2019-07-06 | Discharge: 2019-07-06 | Disposition: A | Discharge status: Home / Self Care | Payer: HRSA Program | Attending: Emergency Medicine | Admitting: Emergency Medicine

## 2019-07-06 ENCOUNTER — Emergency Department (HOSPITAL_COMMUNITY): Payer: HRSA Program

## 2019-07-06 DIAGNOSIS — R109 Unspecified abdominal pain: Secondary | ICD-10-CM | POA: Insufficient documentation

## 2019-07-06 DIAGNOSIS — U071 COVID-19: Secondary | ICD-10-CM | POA: Diagnosis not present

## 2019-07-06 DIAGNOSIS — R509 Fever, unspecified: Secondary | ICD-10-CM | POA: Insufficient documentation

## 2019-07-06 DIAGNOSIS — R197 Diarrhea, unspecified: Secondary | ICD-10-CM | POA: Diagnosis present

## 2019-07-06 LAB — CBC WITH DIFFERENTIAL/PLATELET
Abs Immature Granulocytes: 0.08 10*3/uL — ABNORMAL HIGH (ref 0.00–0.07)
Basophils Absolute: 0 10*3/uL (ref 0.0–0.1)
Basophils Relative: 0 %
Eosinophils Absolute: 0 10*3/uL (ref 0.0–0.5)
Eosinophils Relative: 0 %
HCT: 46.7 % (ref 39.0–52.0)
Hemoglobin: 15.7 g/dL (ref 13.0–17.0)
Immature Granulocytes: 1 %
Lymphocytes Relative: 15 %
Lymphs Abs: 1.2 10*3/uL (ref 0.7–4.0)
MCH: 30.2 pg (ref 26.0–34.0)
MCHC: 33.6 g/dL (ref 30.0–36.0)
MCV: 89.8 fL (ref 80.0–100.0)
Monocytes Absolute: 0.4 10*3/uL (ref 0.1–1.0)
Monocytes Relative: 5 %
Neutro Abs: 6.4 10*3/uL (ref 1.7–7.7)
Neutrophils Relative %: 79 %
Platelets: 263 10*3/uL (ref 150–400)
RBC: 5.2 MIL/uL (ref 4.22–5.81)
RDW: 12.6 % (ref 11.5–15.5)
WBC: 8.1 10*3/uL (ref 4.0–10.5)
nRBC: 0 % (ref 0.0–0.2)

## 2019-07-06 LAB — COMPREHENSIVE METABOLIC PANEL
ALT: 53 U/L — ABNORMAL HIGH (ref 0–44)
AST: 37 U/L (ref 15–41)
Albumin: 4 g/dL (ref 3.5–5.0)
Alkaline Phosphatase: 84 U/L (ref 38–126)
Anion gap: 12 (ref 5–15)
BUN: 13 mg/dL (ref 6–20)
CO2: 22 mmol/L (ref 22–32)
Calcium: 9.2 mg/dL (ref 8.9–10.3)
Chloride: 104 mmol/L (ref 98–111)
Creatinine, Ser: 0.91 mg/dL (ref 0.61–1.24)
GFR calc Af Amer: >60 mL/min (ref >60)
GFR calc non Af Amer: >60 mL/min (ref >60)
Glucose, Bld: 165 mg/dL — ABNORMAL HIGH (ref 70–99)
Potassium: 4.3 mmol/L (ref 3.5–5.1)
Sodium: 138 mmol/L (ref 135–145)
Total Bilirubin: 0.6 mg/dL (ref 0.3–1.2)
Total Protein: 7.7 g/dL (ref 6.5–8.1)

## 2019-07-06 LAB — LIPASE, BLOOD: Lipase: 25 U/L (ref 11–51)

## 2019-07-06 LAB — SARS CORONAVIRUS 2 BY RT PCR (HOSPITAL ORDER, PERFORMED IN ~~LOC~~ HOSPITAL LAB): SARS Coronavirus 2: POSITIVE — AB

## 2019-07-06 MED ORDER — IOHEXOL 300 MG/ML  SOLN
100.0000 mL | Freq: Once | INTRAMUSCULAR | Status: AC | PRN
  Administered 2019-07-06: 100 mL via INTRAVENOUS

## 2019-07-06 MED ORDER — SODIUM CHLORIDE 0.9 % IV BOLUS
1000.0000 mL | Freq: Once | INTRAVENOUS | Status: AC
  Administered 2019-07-06: 1000 mL via INTRAVENOUS

## 2019-07-06 MED ORDER — HYDROMORPHONE HCL 1 MG/ML IJ SOLN
1.0000 mg | Freq: Once | INTRAMUSCULAR | Status: AC
  Administered 2019-07-06: 1 mg via INTRAVENOUS
  Filled 2019-07-06: qty 1

## 2019-07-06 NOTE — ED Provider Notes (Signed)
[facility]   CSN: [id number] Arrival date & time: 07/06/19  1541     History   Chief Complaint Chief Complaint  Patient presents with  . Abdominal Pain  . Diarrhea    HPI Jonathan Guerra is a 38 y.o. male.     HPI Patient reports diarrhea over the past 2 weeks and worsening abdominal pain today.  His abdominal pain for several days.  Has had cough for approximately a week.  He states he was checked for coronavirus last week and was negative.  He denies significant shortness of breath.  He states he feels poor with severe abdominal pain at this time.  Chills without documented fever.  No known sick contacts.  Symptoms are moderate to severe in severity.   Past Medical History:  Diagnosis Date  . Concussion   . Obesity     There are no active problems to display for this patient.   Past Surgical History:  Procedure Laterality Date  . HERNIA REPAIR          Home Medications    Prior to Admission medications   Medication Sig Start Date End Date Taking? Authorizing Provider  albuterol (VENTOLIN HFA) 108 (90 Base) MCG/ACT inhaler Inhale 1-2 puffs into the lungs every 6 (six) hours as needed for wheezing or shortness of breath.   Yes [provider]  Ascorbic Acid (VITAMIN C) 100 MG tablet Take 100 mg by mouth daily.   Yes [provider]  azithromycin (ZITHROMAX) 250 MG tablet Take 250 mg by mouth See admin instructions. Take 500mg  day 1 then 250mg  days 2-6.   Yes [provider]  benzonatate (TESSALON) 100 MG capsule Take 200 mg by mouth 3 (three) times daily as needed for cough.   Yes [provider]  fluconazole (DIFLUCAN) 150 MG tablet Take 150 mg by mouth daily. For 14 days   Yes [provider]  Multiple Vitamin (MULTIVITAMIN WITH MINERALS) TABS tablet Take 1 tablet by mouth daily.   Yes [provider]  OVER THE COUNTER MEDICATION Take 1 capsule by mouth  daily. Clear Lungs herbal decongestant   Yes [provider]  predniSONE (DELTASONE) 20 MG tablet Take 20 mg by mouth See admin instructions. Take 40mg  daily until better then decrease by 10mg  every other day.   Yes [provider]  promethazine-dextromethorphan (PROMETHAZINE-DM) 6.25-15 MG/5ML syrup Take 5 mLs by mouth 4 (four) times daily as needed for cough.   Yes [provider]  zinc gluconate 50 MG tablet Take 50 mg by mouth daily.   Yes [provider]    Family History Family History  Problem Relation Age of Onset  . Diabetes Mother   . Stroke Father   . Heart failure Father     Social History Social History   Tobacco Use  . Smoking status: Never Smoker  . Smokeless tobacco: Never Used  Substance Use Topics  . Alcohol use: Yes    Comment: very rare  . Drug use: No     Allergies   Shrimp [shellfish allergy]   Review of Systems Review of Systems  All other systems reviewed and are negative.    Physical Exam Updated Vital Signs BP 123/65   Pulse 94   Temp 98.5 F (36.9 C) (Oral)   Resp 18   Ht 6\' 3"  (1.905 m)   Wt (!) 172.4 kg   SpO2 95%   BMI 47.50 kg/m  Physical Exam Vitals signs and nursing note reviewed.  Constitutional:      Appearance: He is well-developed.  HENT:     Head: Normocephalic and atraumatic.  Neck:     Musculoskeletal: Normal range of motion.  Cardiovascular:     Rate and Rhythm: Normal rate and regular rhythm.     Heart sounds: Normal heart sounds.  Pulmonary:     Effort: Pulmonary effort is normal. No respiratory distress.     Breath sounds: Normal breath sounds.  Abdominal:     General: There is no distension.     Palpations: Abdomen is soft.     Tenderness: There is no abdominal tenderness.  Musculoskeletal: Normal range of motion.  Skin:    General: Skin is warm and dry.  Neurological:     Mental Status: He is alert and oriented to person, place, and time.  Psychiatric:         Judgment: Judgment normal.      ED Treatments / Results  Labs (all labs ordered are listed, but only abnormal results are displayed) Labs Reviewed  SARS CORONAVIRUS 2 ([facility]) - Abnormal; Notable for the following components:      Result Value   SARS Coronavirus 2 POSITIVE (*)    All other components within normal limits  CBC WITH DIFFERENTIAL/PLATELET - Abnormal; Notable for the following components:   Abs Immature Granulocytes 0.08 (*)    All other components within normal limits  COMPREHENSIVE METABOLIC PANEL - Abnormal; Notable for the following components:   Glucose, Bld 165 (*)    ALT 53 (*)    All other components within normal limits  LIPASE, BLOOD    EKG None  Radiology Ct Abdomen Pelvis W Contrast  Result Date: 07/06/2019 CLINICAL DATA:  Fever, cough, diarrhea EXAM: CT ABDOMEN AND PELVIS WITH CONTRAST TECHNIQUE: Multidetector CT imaging of the abdomen and pelvis was performed using the standard protocol following bolus administration of intravenous contrast. CONTRAST:  152mL OMNIPAQUE IOHEXOL 300 MG/ML  SOLN COMPARISON:  July 05, 2018 FINDINGS: Lower chest: Patchy ground-glass opacities in the visualized lower lungs bilaterally. No effusions. Hepatobiliary: Diffuse low-density throughout the liver compatible with fatty infiltration. No focal abnormality. Gallbladder unremarkable. Pancreas: No focal abnormality or ductal dilatation. Spleen: No focal abnormality.  Normal size. Adrenals/Urinary Tract: No adrenal abnormality. No focal renal abnormality. No stones or hydronephrosis. Urinary bladder is unremarkable. Stomach/Bowel: Stomach, large and small bowel grossly unremarkable. Normal appendix. Vascular/Lymphatic: No evidence of aneurysm or adenopathy. Reproductive: No visible focal abnormality. Other: No free fluid or free air. Small umbilical hernia containing fat. Musculoskeletal: No acute bony abnormality. IMPRESSION: Patchy  ground-glass nodular airspace disease in both lower lungs. Cannot exclude atypical infection. COVID-19 is possible. Fatty infiltration of the liver. Small umbilical hernia containing fat. No acute findings in the abdomen or pelvis. Electronically Signed   By: Rolm Baptise M.D.   On: 07/06/2019 22:50   Dg Chest Portable 1 View  Result Date: 07/06/2019 CLINICAL DATA:  Cough and fever EXAM: PORTABLE CHEST 1 VIEW COMPARISON:  February 12, 2018 FINDINGS: Lungs are clear. Heart is upper normal in size with pulmonary vascularity normal. No adenopathy. No bone lesions. IMPRESSION: No edema or consolidation.  Stable cardiac silhouette. Electronically Signed   By: Lowella Grip III M.D.   On: 07/06/2019 17:00    Procedures Procedures (including critical care time)  Medications Ordered in ED Medications  HYDROmorphone (DILAUDID) injection 1 mg (1 mg Intravenous Given 07/06/19  1703)  sodium chloride 0.9 % bolus 1,000 mL (0 mLs Intravenous Stopped 07/06/19 1907)  iohexol (OMNIPAQUE) 300 MG/ML solution 100 mL (100 mLs Intravenous Contrast Given 07/06/19 2226)     Initial Impression / Assessment and Plan / ED Course  I have reviewed the triage vital signs and the nursing notes.  Pertinent labs & imaging results that were available during my care of the patient were reviewed by me and considered in my medical decision making (see chart for details).        CT imaging reassuring here in the emergency department.  Patient is positive for COVID-19 here in the emergency department.  No indication for additional work-up at this time.  No hypoxia or increased work of breathing.  Standard COVID-19 precautions given.  Likely diarrhea associated with the coronavirus infection.  Patient understands return to the ER for new or worsening symptoms.  All questions answered.    Jonathan Guerra was evaluated in Emergency Department on 07/06/2019 for the symptoms described in the history of present illness. He was  evaluated in the context of the global COVID-19 pandemic, which necessitated consideration that the patient might be at risk for infection with the SARS-CoV-2 virus that causes COVID-19. Institutional protocols and algorithms that pertain to the evaluation of patients at risk for COVID-19 are in a state of rapid change based on information released by regulatory bodies including the CDC and federal and state organizations. These policies and algorithms were followed during the patient's care in the ED.   Final Clinical Impressions(s) / ED Diagnoses   Final diagnoses:  Abdominal pain, unspecified abdominal location  COVID-19 virus infection    ED Discharge Orders    None       Azalia Bilisampos, Agnieszka Newhouse, MD 07/06/19 2324

## 2019-07-06 NOTE — ED Triage Notes (Signed)
Pt arrives via pov with reports of diarrhea X3 weeks, fever and cough for approx 1 week and abd pain that started today. Pt diaphoretic during triage.

## 2019-07-06 NOTE — ED Notes (Signed)
Family at bedside. 

## 2019-07-06 NOTE — ED Notes (Signed)
Jonathan Guerra; [phone]

## 2019-08-14 IMAGING — CR DG CHEST 2V
2 series · 2 of 2 positions shown · non-contrast
Comparison: 02/12/2018

CLINICAL DATA: Left chest pain radiating into the left shoulder and
scapula for 2 days

EXAM:
CHEST - 2 VIEW

[w chest pa]
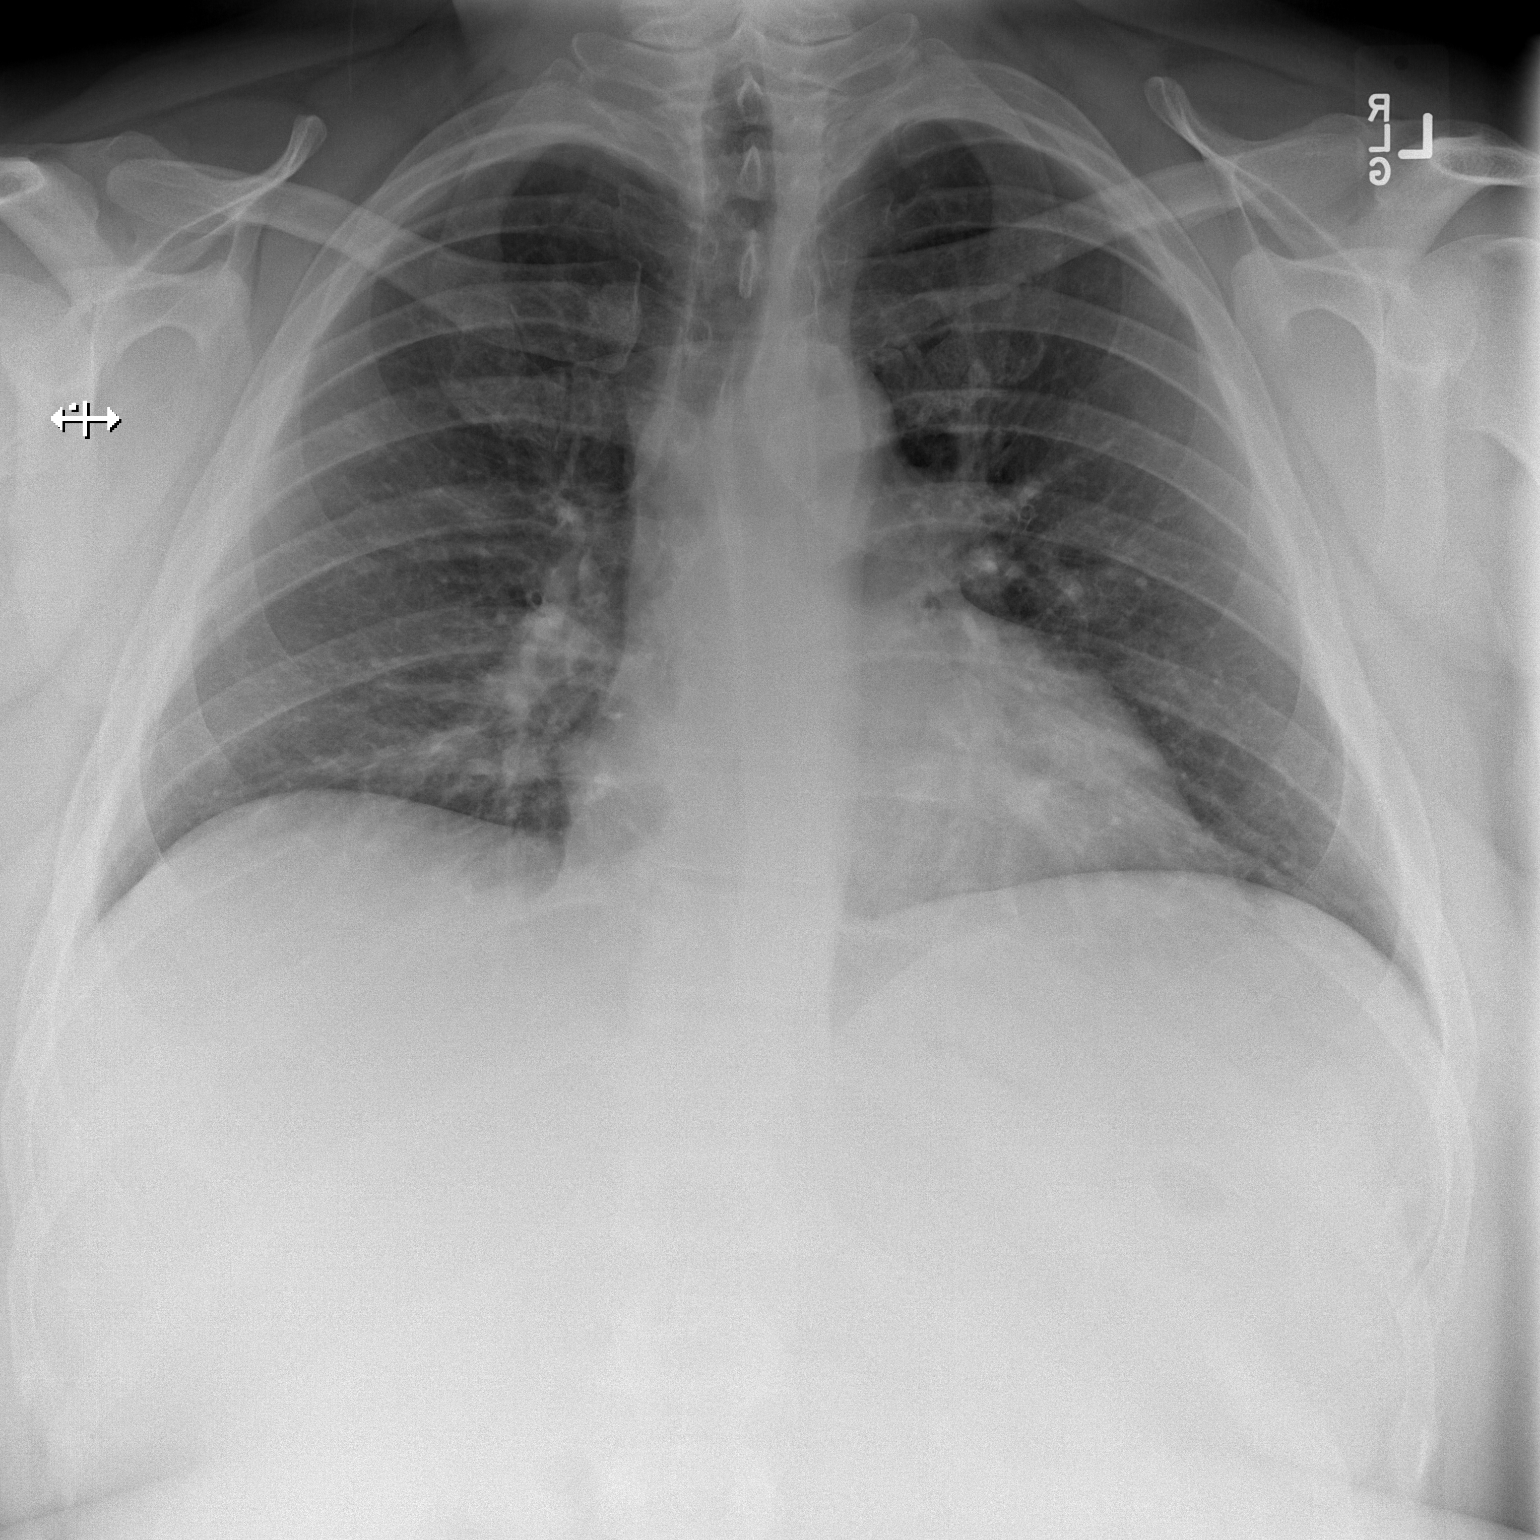

[w chest lat]
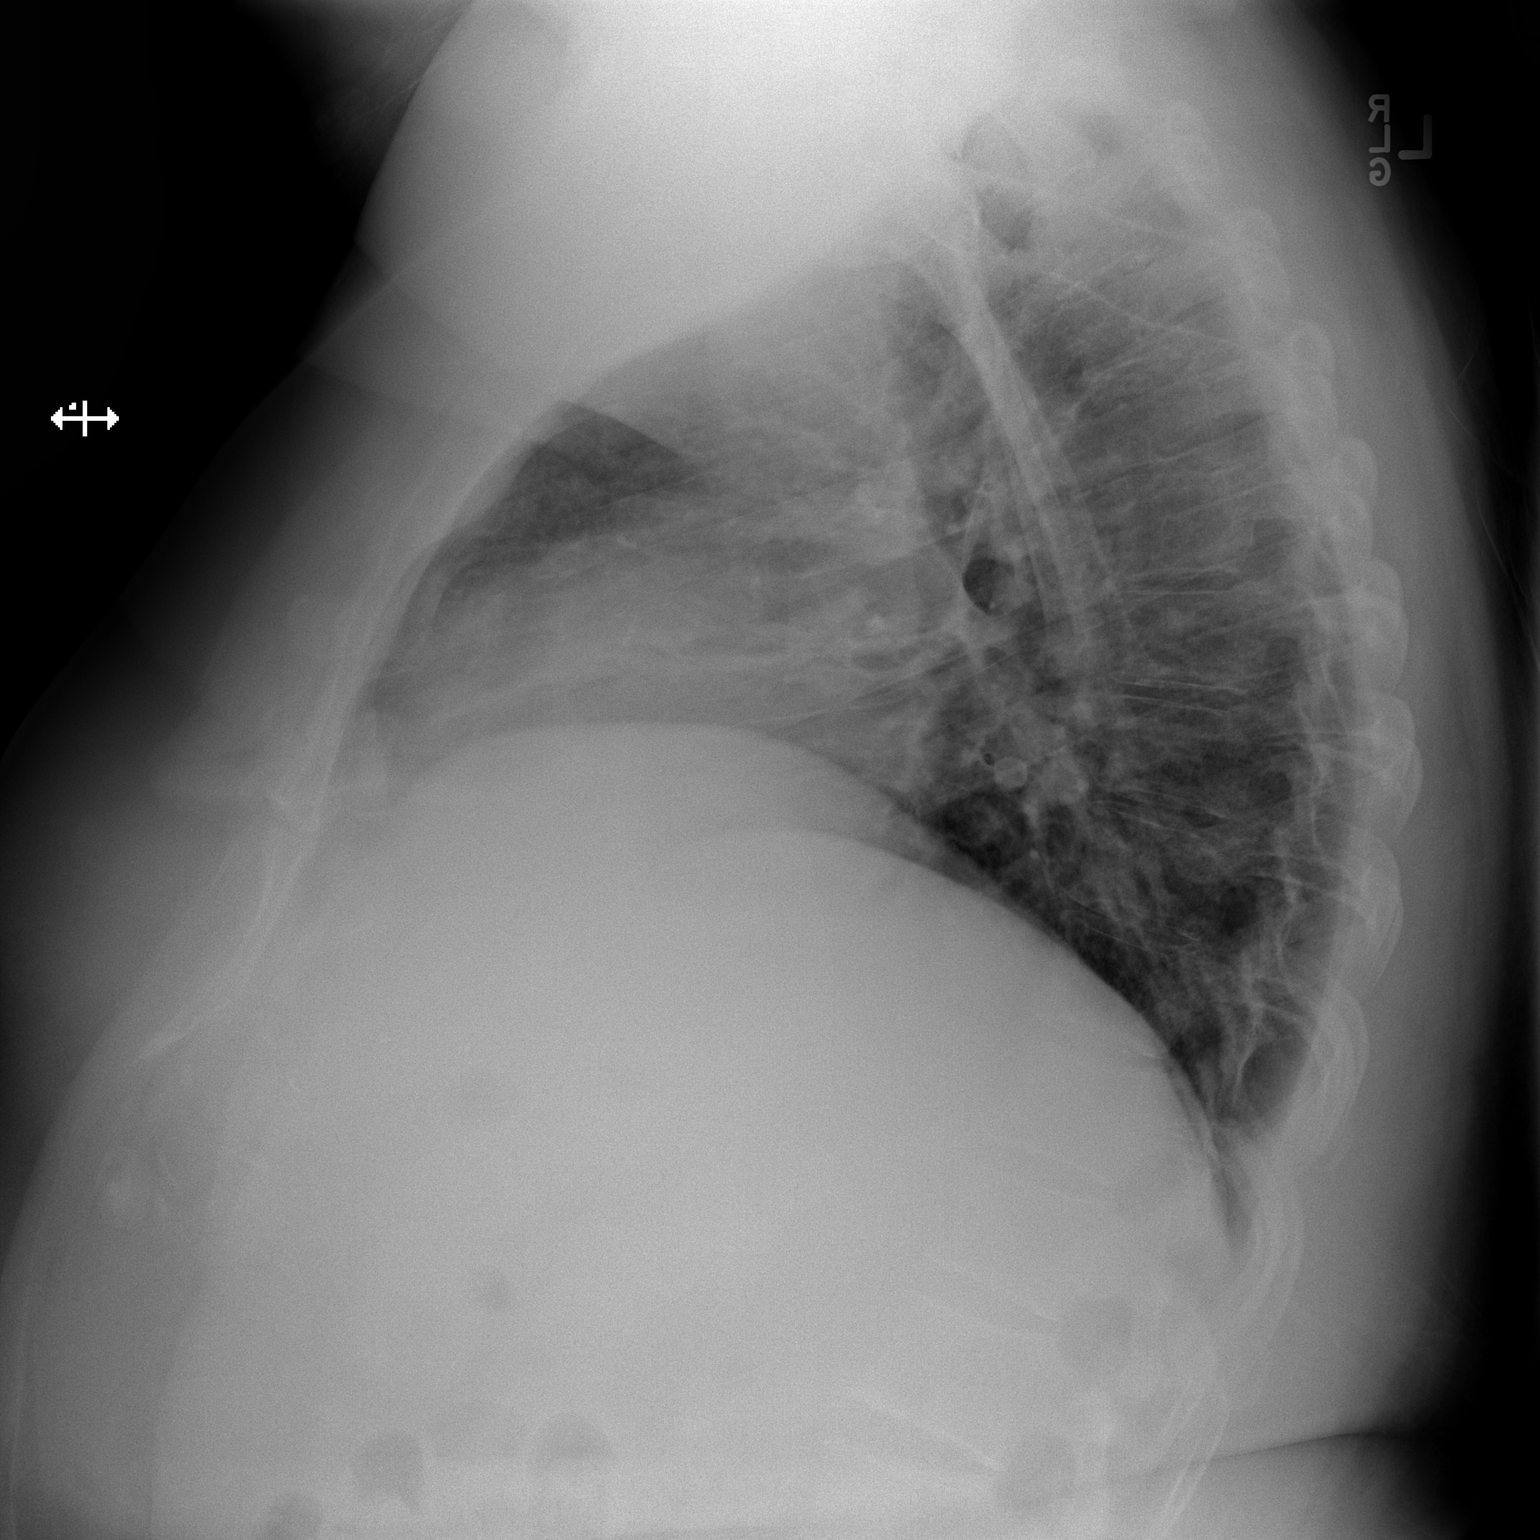

[2 of 2 positions shown; findings below may reference images not displayed]

FINDINGS: The heart size and mediastinal contours are within normal limits.
Both lungs are clear. The visualized skeletal structures are
unremarkable.
IMPRESSION: No active cardiopulmonary disease.

## 2020-08-14 IMAGING — CT CT ABDOMEN AND PELVIS WITH CONTRAST
2 of 5 series · 17 of 46 positions shown, 19 images · IV contrast (APPLIED)
Comparison: July 05, 2018

CLINICAL DATA: Fever, cough, diarrhea

EXAM:
CT ABDOMEN AND PELVIS WITH CONTRAST
TECHNIQUE: Multidetector CT imaging of the abdomen and pelvis was performed
using the standard protocol following bolus administration of
intravenous contrast.
CONTRAST:  100mL OMNIPAQUE IOHEXOL 300 MG/ML  SOLN

[Series 4: thins · axial · 0.98mm/px · z∈[+950,+1503]mm · 14 of 856 slices shown, 16 images]
[im 33/856  soft-tissue]
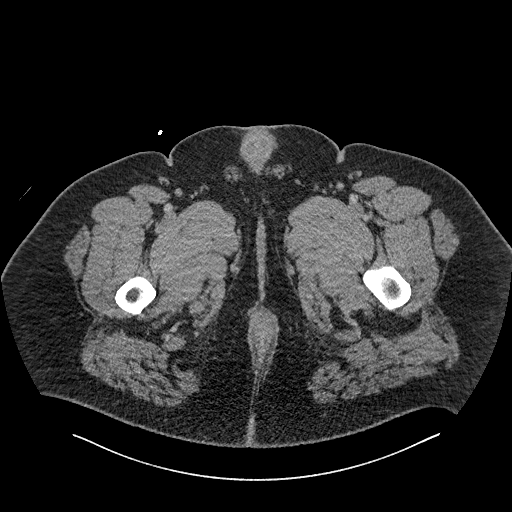
[im 33/856  bone]
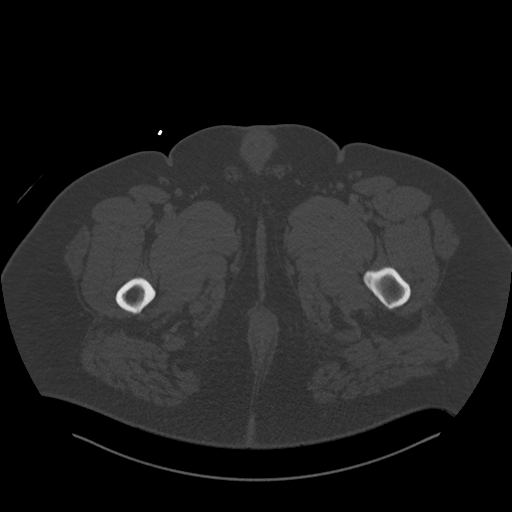
[im 99/856  soft-tissue]
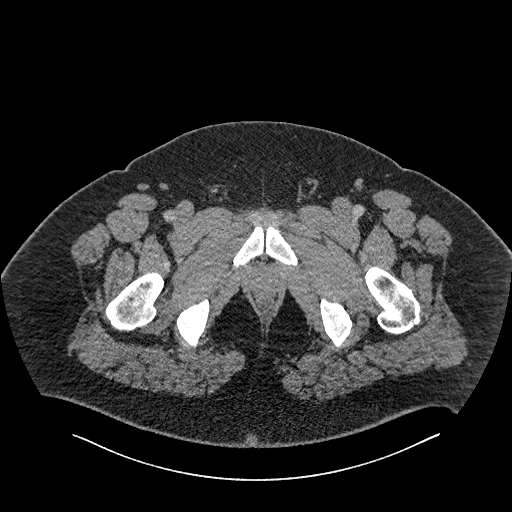
[im 165/856  soft-tissue]
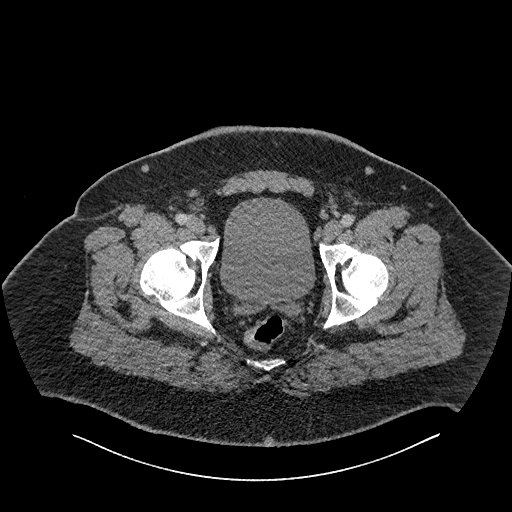
[im 231/856  soft-tissue]
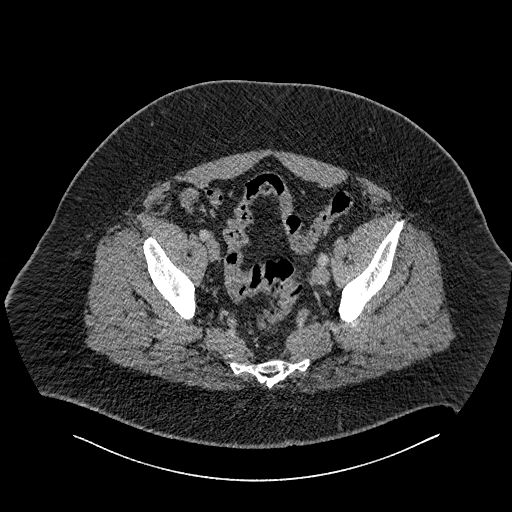
[im 296/856  soft-tissue]
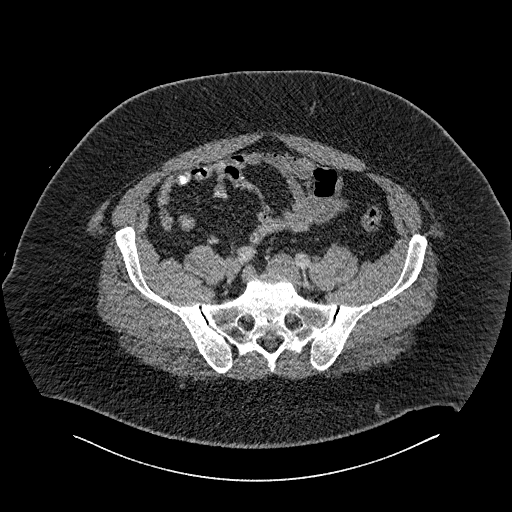
[im 329/856  soft-tissue]
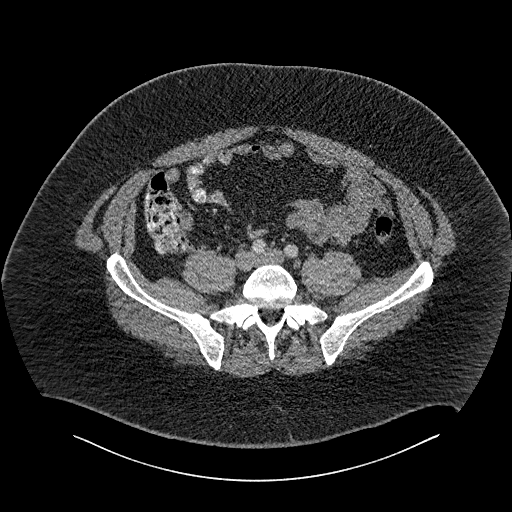
[im 395/856  soft-tissue]
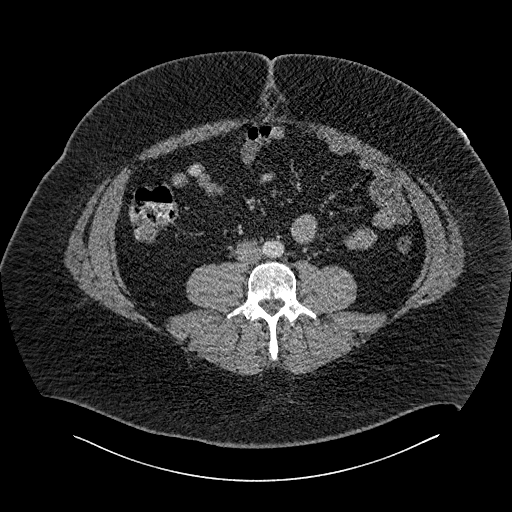
[im 461/856  soft-tissue]
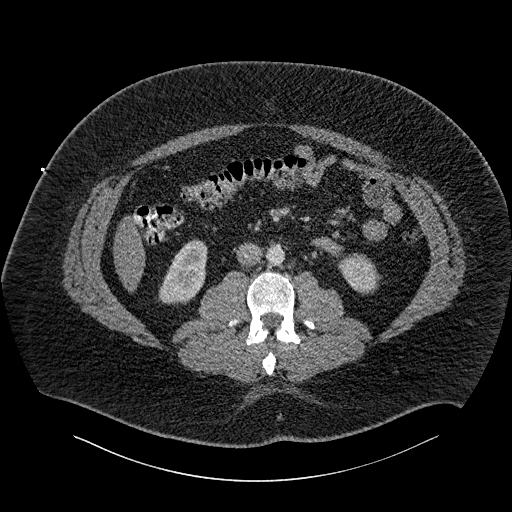
[im 527/856  soft-tissue]
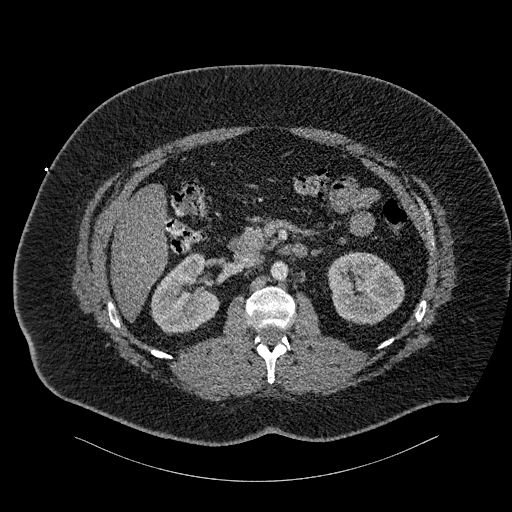
[im 527/856  bone]
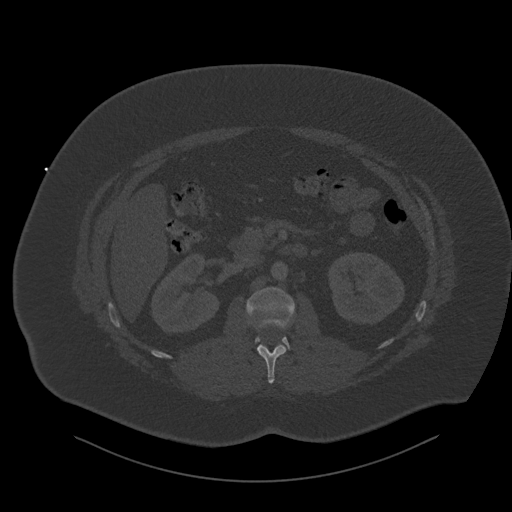
[im 560/856  soft-tissue]
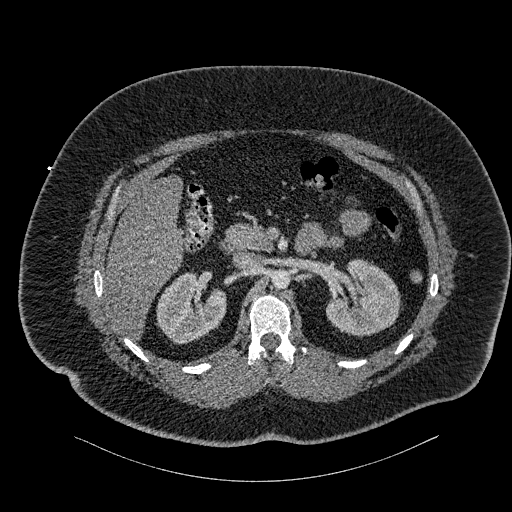
[im 625/856  soft-tissue]
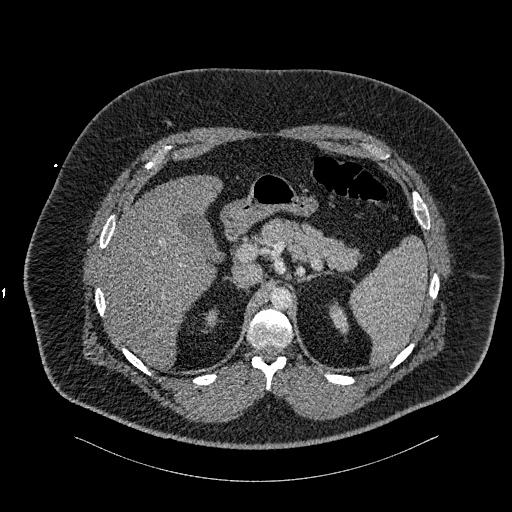
[im 691/856  soft-tissue]
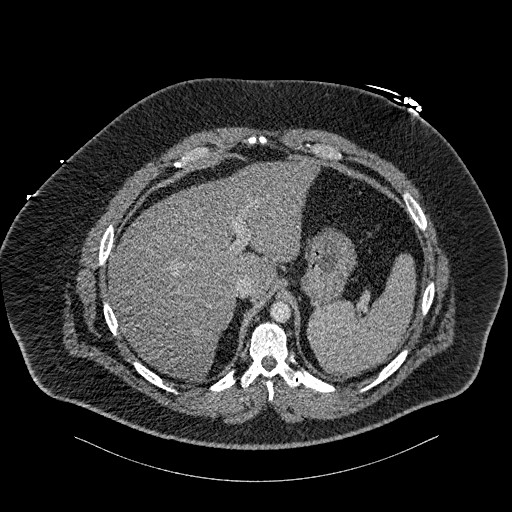
[im 757/856  soft-tissue]
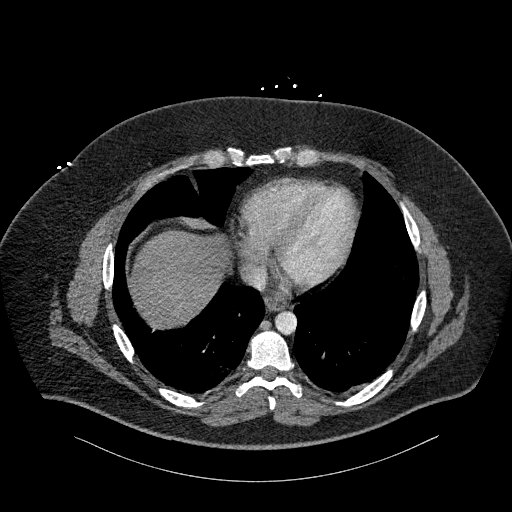
[im 823/856  soft-tissue]
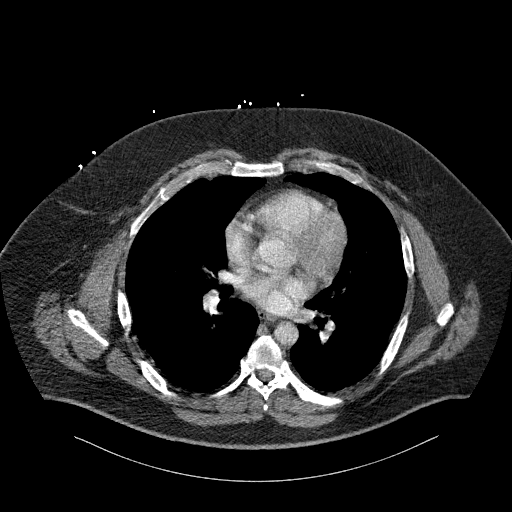

[Series 5: coronal soft tissue · coronal · 1.07mm/px · 3 of 142 slices shown]
[im 48/142  soft-tissue]
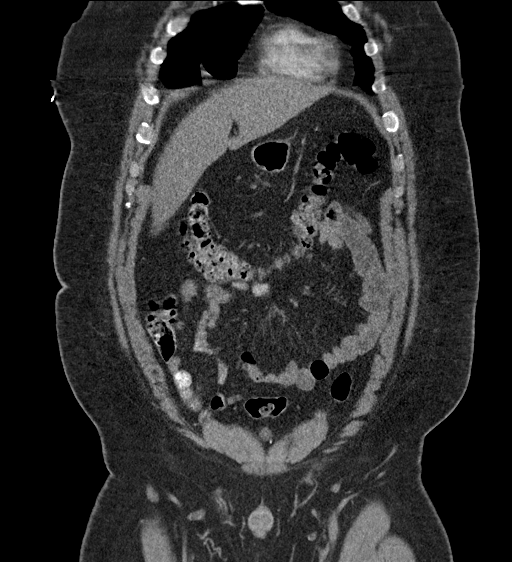
[im 63/142  soft-tissue]
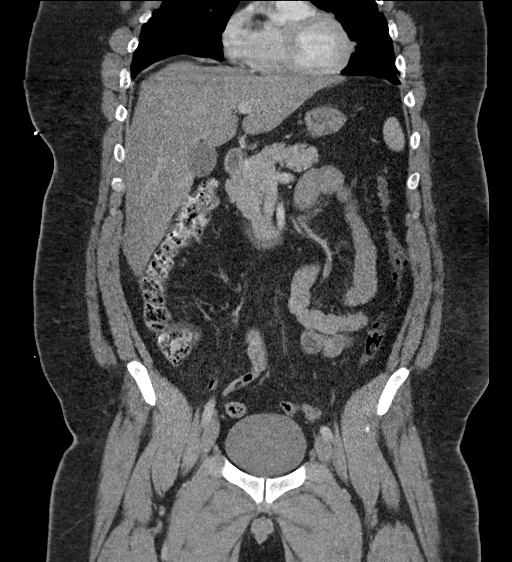
[im 79/142  soft-tissue]
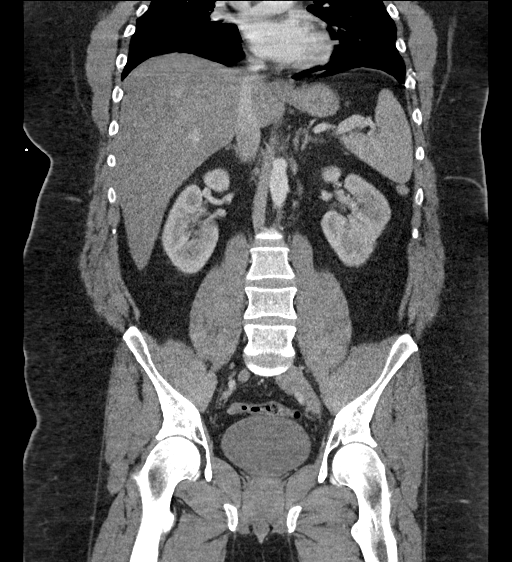

[17 of 46 positions shown; findings below may reference images not displayed]

FINDINGS: Lower chest: Patchy ground-glass opacities in the visualized lower
lungs bilaterally. No effusions.

Hepatobiliary: Diffuse low-density throughout the liver compatible
with fatty infiltration. No focal abnormality. Gallbladder
unremarkable.

Pancreas: No focal abnormality or ductal dilatation.

Spleen: No focal abnormality.  Normal size.

Adrenals/Urinary Tract: No adrenal abnormality. No focal renal
abnormality. No stones or hydronephrosis. Urinary bladder is
unremarkable.

Stomach/Bowel: Stomach, large and small bowel grossly unremarkable.
Normal appendix.

Vascular/Lymphatic: No evidence of aneurysm or adenopathy.

Reproductive: No visible focal abnormality.

Other: No free fluid or free air. Small umbilical hernia containing
fat.

Musculoskeletal: No acute bony abnormality.
IMPRESSION: Patchy ground-glass nodular airspace disease in both lower lungs.
Cannot exclude atypical infection. [id number] is possible.

Fatty infiltration of the liver.

Small umbilical hernia containing fat.

No acute findings in the abdomen or pelvis.
